# Patient Record
Sex: Male | Born: 1985 | Race: White | Hispanic: No | Marital: Single | Smoking: Former smoker
Health system: Southern US, Community
[De-identification: ages and names within clinical notes are randomized; demographics above are authoritative.]

---

## 2008-05-31 ENCOUNTER — Emergency Department (HOSPITAL_COMMUNITY): Admission: EM | Admit: 2008-05-31 | Discharge: 2008-05-31 | Payer: Self-pay | Admitting: Family Medicine

## 2011-01-15 ENCOUNTER — Emergency Department (INDEPENDENT_AMBULATORY_CARE_PROVIDER_SITE_OTHER): Payer: PRIVATE HEALTH INSURANCE

## 2011-01-15 ENCOUNTER — Emergency Department (HOSPITAL_BASED_OUTPATIENT_CLINIC_OR_DEPARTMENT_OTHER)
Admission: EM | Admit: 2011-01-15 | Discharge: 2011-01-15 | Disposition: A | Discharge status: Home / Self Care | Payer: PRIVATE HEALTH INSURANCE | Attending: Emergency Medicine | Admitting: Emergency Medicine

## 2011-01-15 DIAGNOSIS — M25519 Pain in unspecified shoulder: Secondary | ICD-10-CM

## 2011-01-15 DIAGNOSIS — IMO0002 Reserved for concepts with insufficient information to code with codable children: Secondary | ICD-10-CM | POA: Insufficient documentation

## 2011-01-15 DIAGNOSIS — Y9374 Activity, frisbee: Secondary | ICD-10-CM | POA: Insufficient documentation

## 2011-01-15 DIAGNOSIS — W1809XA Striking against other object with subsequent fall, initial encounter: Secondary | ICD-10-CM | POA: Insufficient documentation

## 2014-10-21 ENCOUNTER — Ambulatory Visit (INDEPENDENT_AMBULATORY_CARE_PROVIDER_SITE_OTHER): Payer: BLUE CROSS/BLUE SHIELD | Admitting: Family Medicine

## 2014-10-21 VITALS — BP 130/90 | HR 72 | Temp 98.1°F | Resp 16 | Ht 72.0 in | Wt 185.0 lb

## 2014-10-21 DIAGNOSIS — L600 Ingrowing nail: Secondary | ICD-10-CM

## 2014-10-21 DIAGNOSIS — M79672 Pain in left foot: Secondary | ICD-10-CM

## 2014-10-21 DIAGNOSIS — M79675 Pain in left toe(s): Secondary | ICD-10-CM

## 2014-10-21 MED ORDER — CEPHALEXIN 500 MG PO CAPS: 500.0000 mg | ORAL_CAPSULE | Freq: Three times a day (TID) | ORAL | Status: DC

## 2014-10-21 MED ORDER — HYDROCODONE-ACETAMINOPHEN 5-325 MG PO TABS: 1.0000 | ORAL_TABLET | ORAL | Status: DC | PRN

## 2014-10-21 NOTE — Patient Instructions (Signed)
Take pain pills one every 4-6 hours only if needed for severe pain  Take ibuprofen 600-800 mg 3 times daily if needed for moderate pain  Take the cephalexin antibiotic one pill 3 times daily

## 2014-10-21 NOTE — Progress Notes (Signed)
Subjective: 29 year old man who has an ingrown left large toenail. He tried to do a little running a few months ago, ran a 4 mile race 2. He had one in his right large toenail a year ago months ago. He has been fighting a [place] This been hurting him a lot. He is tried to do a wedge resection of the medial portion of the nail and let it grow out some. He has tried to cut down at the angle. He continues to hurt a lot.  Objective: Macerated appearing skin on the lateral portion of the toe nail, very tender to touch, swollen. Nail is been partially trimmed away.  Right toenail has almost regrown from 6 months ago  Assessment: Ingrowing left large toenail lateral portion  Plan: Partial toenail evulsion.

## 2014-10-21 NOTE — Progress Notes (Signed)
Procedure: Risk and benefits discussed and verbal consent obtained. The patient was anesthetized using 7 cc of 2% lidocaine and 3 cc of .05 Marcaine via digital block and local injection.  Sterile prep and drape. The medial 1/3 of the toe nail was lifted and clipped away.  Xeroform gauze placed and clean pressure dressing applied by CNA.  Wound instructions provided.  Patient tolerated procedure without complaint. Deliah BostonMichael Adylee Leonardo, MS, PA-C   9:56 AM, 10/21/2014

## 2015-03-28 ENCOUNTER — Other Ambulatory Visit: Payer: Self-pay | Admitting: Family Medicine

## 2015-03-28 DIAGNOSIS — R1011 Right upper quadrant pain: Secondary | ICD-10-CM

## 2015-04-01 ENCOUNTER — Ambulatory Visit
Admission: RE | Admit: 2015-04-01 | Discharge: 2015-04-01 | Disposition: A | Discharge status: Home / Self Care | Payer: BLUE CROSS/BLUE SHIELD | Source: Ambulatory Visit | Attending: Family Medicine | Admitting: Family Medicine

## 2015-04-01 DIAGNOSIS — R1011 Right upper quadrant pain: Secondary | ICD-10-CM

## 2015-04-02 ENCOUNTER — Other Ambulatory Visit: Payer: PRIVATE HEALTH INSURANCE

## 2015-04-09 ENCOUNTER — Other Ambulatory Visit (HOSPITAL_COMMUNITY): Payer: Self-pay | Admitting: Family Medicine

## 2015-04-09 DIAGNOSIS — R1011 Right upper quadrant pain: Secondary | ICD-10-CM

## 2015-04-17 ENCOUNTER — Encounter (HOSPITAL_COMMUNITY): Payer: Self-pay | Admitting: *Deleted

## 2015-04-17 ENCOUNTER — Emergency Department (HOSPITAL_COMMUNITY)
Admission: EM | Admit: 2015-04-17 | Discharge: 2015-04-17 | Disposition: A | Discharge status: Home / Self Care | Payer: BLUE CROSS/BLUE SHIELD | Attending: Emergency Medicine | Admitting: Emergency Medicine

## 2015-04-17 DIAGNOSIS — Z87891 Personal history of nicotine dependence: Secondary | ICD-10-CM | POA: Diagnosis not present

## 2015-04-17 DIAGNOSIS — R1011 Right upper quadrant pain: Secondary | ICD-10-CM | POA: Diagnosis not present

## 2015-04-17 DIAGNOSIS — R109 Unspecified abdominal pain: Secondary | ICD-10-CM | POA: Diagnosis present

## 2015-04-17 LAB — CBC
HCT: 43.7 % (ref 39.0–52.0)
Hemoglobin: 15.6 g/dL (ref 13.0–17.0)
MCH: 30.5 pg (ref 26.0–34.0)
MCHC: 35.7 g/dL (ref 30.0–36.0)
MCV: 85.4 fL (ref 78.0–100.0)
Platelets: 231 10*3/uL (ref 150–400)
RBC: 5.12 MIL/uL (ref 4.22–5.81)
RDW: 12.8 % (ref 11.5–15.5)
WBC: 8.2 10*3/uL (ref 4.0–10.5)

## 2015-04-17 LAB — COMPREHENSIVE METABOLIC PANEL
ALK PHOS: 56 U/L (ref 38–126)
ALT: 35 U/L (ref 17–63)
AST: 32 U/L (ref 15–41)
Albumin: 4.7 g/dL (ref 3.5–5.0)
Anion gap: 8 (ref 5–15)
BILIRUBIN TOTAL: 0.8 mg/dL (ref 0.3–1.2)
BUN: 11 mg/dL (ref 6–20)
CALCIUM: 10.2 mg/dL (ref 8.9–10.3)
CO2: 29 mmol/L (ref 22–32)
CREATININE: 1.05 mg/dL (ref 0.61–1.24)
Chloride: 102 mmol/L (ref 101–111)
GFR calc Af Amer: >60 mL/min (ref >60)
GFR calc non Af Amer: >60 mL/min (ref >60)
GLUCOSE: 103 mg/dL — AB (ref 65–99)
Potassium: 4 mmol/L (ref 3.5–5.1)
Sodium: 139 mmol/L (ref 135–145)
TOTAL PROTEIN: 7.9 g/dL (ref 6.5–8.1)

## 2015-04-17 LAB — LIPASE, BLOOD: LIPASE: 23 U/L (ref 22–51)

## 2015-04-17 NOTE — ED Notes (Signed)
Pt c/o right upper abdominal pain. Pt had [place] two wednesdays ago, dx with polyps on gall bladder. Pt states pain has increased, denies n/v/d.

## 2015-04-17 NOTE — Discharge Instructions (Signed)

## 2015-04-17 NOTE — ED Provider Notes (Signed)
CSN: [id number]     Arrival date & time 04/17/15  2030 History   First MD Initiated Contact with Patient 04/17/15 2238     Chief Complaint  Patient presents with  . Abdominal Pain     (Consider location/radiation/quality/duration/timing/severity/associated sxs/prior Treatment) Patient is a 29 y.o. male presenting with abdominal pain. The history is provided by the patient.  Abdominal Pain Pain location:  R flank Pain quality: sharp and shooting   Pain radiates to:  Does not radiate Pain severity:  Moderate Onset quality:  Gradual Duration:  36 weeks Timing:  Constant Progression:  Worsening Chronicity:  New Relieved by:  Nothing Worsened by:  Nothing tried Ineffective treatments:  None tried Associated symptoms: no chest pain, no chills, no diarrhea, no fever, no shortness of breath and no vomiting    29 yo M with a chief complaint of right side pain. This been going on for at least 9 months. Patient states that is getting progressively worse. Somewhat comes and goes. Patient says that the dull ache with a burning sensation. Denies any nausea or vomiting with this. Patient had a sudden worsening episode while eating dinner this evening wife said he had pale and diaphoretic. Lasted for a short period of time. On arrival here patient asymptomatic. Denies fevers chills diarrhea. Denies dark stools or blood in the stool.  History reviewed. No pertinent past medical history. History reviewed. No pertinent past surgical history. History reviewed. No pertinent family history. Social History  Substance Use Topics  . Smoking status: Former Smoker    Types: Cigarettes  . Smokeless tobacco: Never Used  . Alcohol Use: 0.0 oz/week    0 Standard drinks or equivalent per week    Review of Systems  Constitutional: Negative for fever and chills.  HENT: Negative for congestion and facial swelling.   Eyes: Negative for discharge and visual disturbance.  Respiratory: Negative for shortness of  breath.   Cardiovascular: Negative for chest pain and palpitations.  Gastrointestinal: Positive for abdominal pain. Negative for vomiting and diarrhea.  Musculoskeletal: Negative for myalgias and arthralgias.  Skin: Negative for color change and rash.  Neurological: Negative for tremors, syncope and headaches.  Psychiatric/Behavioral: Negative for confusion and dysphoric mood.      Allergies  Review of patient's allergies indicates no known allergies.  Home Medications   Prior to Admission medications   Medication Sig Start Date End Date Taking? Authorizing Provider  Misc Natural Products (ENERGY SUPPORT PO) Take 1 tablet by mouth 2 (two) times daily.   Yes Historical Provider, MD  OVER THE COUNTER MEDICATION Take 2 packets by mouth 2 (two) times daily. Double X. Vitamin packet.   Yes Historical Provider, MD   BP 148/91 mmHg  Pulse 74  Temp(Src) 98.3 F (36.8 C) (Oral)  Resp 22  Ht  (1.803 m)  Wt 190 lb (86.183 kg)  BMI 26.51 kg/m2  SpO2 99% Physical Exam  Constitutional: He is oriented to person, place, and time. He appears well-developed and well-nourished.  HENT:  Head: Normocephalic and atraumatic.  Eyes: EOM are normal. Pupils are equal, round, and reactive to light.  Neck: Normal range of motion. Neck supple. No JVD present.  Cardiovascular: Normal rate and regular rhythm.  Exam reveals no gallop and no friction rub.   No murmur heard. Pulmonary/Chest: No respiratory distress. He has no wheezes.  Abdominal: He exhibits no distension. There is tenderness (mild TTP about the R side lateral to normal location of gallbladder.  No noted chest  wall ttp). There is no rebound and no guarding.  Musculoskeletal: Normal range of motion.  Neurological: He is alert and oriented to person, place, and time.  Skin: No rash noted. No pallor.  Psychiatric: He has a normal mood and affect. His behavior is normal.    ED Course  Procedures (including critical care time) Labs  Review Labs Reviewed  COMPREHENSIVE METABOLIC PANEL - Abnormal; Notable for the following:    Glucose, Bld 103 (*)    All other components within normal limits  LIPASE, BLOOD  CBC  URINALYSIS, ROUTINE W REFLEX MICROSCOPIC (NOT AT [facility])    Imaging Review No results found. I have personally reviewed and evaluated these images and lab results as part of my medical decision-making.   EKG Interpretation None      MDM   Final diagnoses:  Right upper quadrant pain    29 yo M with a chief complaint of right thigh pain. Patient had an ultrasound done about 2 weeks ago that showed that he had a gallbladder polyp as a nuclear medicine scan ordered later this month. Benign abdominal exam laboratory work unremarkable. Recommend that the patient follow-up with his PCP for other etiologies of this pain. Discussed possibility of gastric versus duodenal ulcers as an alternative source.  11:30 PM:  I have discussed the diagnosis/risks/treatment options with the patient and family and believe the pt to be eligible for discharge home to follow-up with PCP. We also discussed returning to the ED immediately if new or worsening sx occur. We discussed the sx which are most concerning (e.g., sudden worsening pain, fever) that necessitate immediate return. Medications administered to the patient during their visit and any new prescriptions provided to the patient are listed below.  Medications given during this visit Medications - No data to display  New Prescriptions   No medications on file     The patient appears reasonably screen and/or stabilized for discharge and I doubt any other medical condition or other [facility] requiring further screening, evaluation, or treatment in the ED at this time prior to discharge.      Melene Plan, DO 04/17/15 [phone]

## 2015-04-17 NOTE — ED Notes (Signed)
Pt verbalized understanding of d/c instructions and has no further questions. Pt stable and NAD. Pt to follow up with nuclear scan on Tuesday,.

## 2015-04-22 ENCOUNTER — Ambulatory Visit (HOSPITAL_COMMUNITY)
Admission: RE | Admit: 2015-04-22 | Discharge: 2015-04-22 | Disposition: A | Discharge status: Home / Self Care | Payer: BLUE CROSS/BLUE SHIELD | Source: Ambulatory Visit | Attending: Family Medicine | Admitting: Family Medicine

## 2015-04-22 DIAGNOSIS — R1011 Right upper quadrant pain: Secondary | ICD-10-CM

## 2015-04-22 MED ORDER — STERILE WATER FOR INJECTION IJ SOLN
INTRAMUSCULAR | Status: AC
  Administered 2015-04-22: 5 mL
  Filled 2015-04-22: qty 10

## 2015-04-22 MED ORDER — SINCALIDE 5 MCG IJ SOLR
INTRAMUSCULAR | Status: AC
  Administered 2015-04-22: 1.7 ug via INTRAVENOUS
  Filled 2015-04-22: qty 5

## 2015-04-22 MED ORDER — TECHNETIUM TC 99M MEBROFENIN IV KIT
5.4000 | PACK | Freq: Once | INTRAVENOUS | Status: DC | PRN
  Administered 2015-04-22: 5 via INTRAVENOUS
  Filled 2015-04-22: qty 6

## 2015-04-22 MED ORDER — SINCALIDE 5 MCG IJ SOLR
0.0200 ug/kg | Freq: Once | INTRAMUSCULAR | Status: AC
  Administered 2015-04-22: 1.7 ug via INTRAVENOUS

## 2015-05-26 ENCOUNTER — Ambulatory Visit (INDEPENDENT_AMBULATORY_CARE_PROVIDER_SITE_OTHER): Payer: BLUE CROSS/BLUE SHIELD | Admitting: Family Medicine

## 2015-05-26 VITALS — BP 106/68 | HR 99 | Temp 98.5°F | Resp 18 | Ht 72.0 in | Wt 178.0 lb

## 2015-05-26 DIAGNOSIS — J028 Acute pharyngitis due to other specified organisms: Secondary | ICD-10-CM | POA: Diagnosis not present

## 2015-05-26 MED ORDER — AMOXICILLIN 875 MG PO TABS: 875.0000 mg | ORAL_TABLET | Freq: Two times a day (BID) | ORAL | Status: DC

## 2015-05-26 NOTE — Progress Notes (Signed)
This chart was scribed for Joe Sidle, MD by Stann Ore, medical scribe at [facility].The patient was seen in exam room 2 and the patient's care was started at 5:09 PM.  Patient ID: Joe Bauer MRN: [id number], DOB: 1986-02-16, 29 y.o. Date of Encounter: 05/26/2015  Primary Physician: No PCP Per Patient  Chief Complaint:  Chief Complaint  Patient presents with  . Sore Throat    x 2 days, spitting up blood, pus in the back of throat     HPI:  Joe Bauer is a 29 y.o. male who presents to [facility] complaining of URI symptoms that started 3 weeks ago.  His wife thought it became viral and had pus pockets. He also started to spit up blood noticed 2 days ago.   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   History reviewed. No pertinent past medical history.   Home Meds: Prior to Admission medications   Medication Sig Start Date End Date Taking? Authorizing Provider  Misc Natural Products (ENERGY SUPPORT PO) Take 1 tablet by mouth 2 (two) times daily.   Yes Historical Provider, MD  OVER THE COUNTER MEDICATION Take 2 packets by mouth 2 (two) times daily. Double X. Vitamin packet.   Yes Historical Provider, MD  amoxicillin (AMOXIL) 875 MG tablet Take 1 tablet (875 mg total) by mouth 2 (two) times daily. 05/26/15   Joe Sidle, MD    Allergies: No Known Allergies  Social History   Social History  . Marital Status: Single    Spouse Name: N/A  . Number of Children: N/A  . Years of Education: N/A   Occupational History  . Not on file.   Social History Main Topics  . Smoking status: Former Smoker    Types: Cigarettes  . Smokeless tobacco: Never Used  . Alcohol Use: 0.0 oz/week    0 Standard drinks or equivalent per week  . Drug Use: No  . Sexual Activity: Not on file   Other Topics Concern  . Not on file   Social History Narrative     Review of Systems: Constitutional: negative for chills, fever,  night sweats, weight changes, or fatigue  HEENT: negative for vision changes, hearing loss, congestion, rhinorrhea, epistaxis, or sinus pressure; positive for sore throat Cardiovascular: negative for chest pain or palpitations Respiratory: negative for hemoptysis, wheezing, shortness of breath, or cough Abdominal: negative for abdominal pain, nausea, vomiting, diarrhea, or constipation Dermatological: negative for rash Neurologic: negative for headache, dizziness, or syncope All other systems reviewed and are otherwise negative with the exception to those above and in the HPI.  Physical Exam: Blood pressure 106/68, pulse 99, temperature 98.5 F (36.9 C), temperature source Oral, resp. rate 18, height 6' (1.829 m), weight 178 lb (80.74 kg), SpO2 98 %., Body mass index is 24.14 kg/(m^2). General: Well developed, well nourished, in no acute distress. Head: Normocephalic, atraumatic, eyes without discharge, sclera non-icteric, nares are without discharge. Bilateral auditory canals clear, TM's are without perforation, pearly grey and translucent with reflective cone of light bilaterally. Posterior oropharynx erythema  Neck: Supple. No thyromegaly. Full ROM. No lymphadenopathy. Lungs: Clear bilaterally to auscultation without wheezes, rales, or rhonchi. Breathing is unlabored. Heart: RRR with S1 S2. No murmurs, rubs, or gallops appreciated. Abdomen: Soft, non-tender, non-distended with normoactive bowel sounds. No hepatomegaly. No rebound/guarding. No obvious abdominal masses. Msk:  Strength and tone normal for age. Extremities/Skin: Warm and dry. No clubbing or cyanosis.  No edema. No rashes or suspicious lesions. Neuro: Alert and oriented X 3. Moves all extremities spontaneously. Gait is normal. CNII-XII grossly in tact. Psych:  Responds to questions appropriately with a normal affect.    ASSESSMENT AND PLAN:  29 y.o. year old male with  This chart was scribed in my presence and reviewed by me  personally.    ICD-9-CM ICD-10-CM   1. Acute pharyngitis due to other specified organisms 462 J02.8 amoxicillin (AMOXIL) 875 MG tablet      By signing my name below, I, Stann Ore, attest that this documentation has been prepared under the direction and in the presence of Joe Sidle, MD. Electronically Signed: Stann Ore, Scribe. 05/26/2015 , 5:22 PM .  Signed, Joe Sidle, MD 05/26/2015 5:22 PM

## 2015-05-26 NOTE — Patient Instructions (Signed)

## 2015-12-25 ENCOUNTER — Emergency Department (HOSPITAL_COMMUNITY): Payer: BLUE CROSS/BLUE SHIELD

## 2015-12-25 ENCOUNTER — Encounter (HOSPITAL_COMMUNITY): Payer: Self-pay | Admitting: *Deleted

## 2015-12-25 ENCOUNTER — Emergency Department (HOSPITAL_COMMUNITY)
Admission: EM | Admit: 2015-12-25 | Discharge: 2015-12-25 | Disposition: A | Discharge status: Home / Self Care | Payer: BLUE CROSS/BLUE SHIELD | Attending: Emergency Medicine | Admitting: Emergency Medicine

## 2015-12-25 DIAGNOSIS — R1011 Right upper quadrant pain: Secondary | ICD-10-CM | POA: Diagnosis present

## 2015-12-25 DIAGNOSIS — R1013 Epigastric pain: Secondary | ICD-10-CM | POA: Diagnosis not present

## 2015-12-25 DIAGNOSIS — R509 Fever, unspecified: Secondary | ICD-10-CM | POA: Diagnosis not present

## 2015-12-25 DIAGNOSIS — Z87891 Personal history of nicotine dependence: Secondary | ICD-10-CM | POA: Insufficient documentation

## 2015-12-25 DIAGNOSIS — R197 Diarrhea, unspecified: Secondary | ICD-10-CM | POA: Diagnosis not present

## 2015-12-25 DIAGNOSIS — R112 Nausea with vomiting, unspecified: Secondary | ICD-10-CM

## 2015-12-25 DIAGNOSIS — Z79899 Other long term (current) drug therapy: Secondary | ICD-10-CM | POA: Diagnosis not present

## 2015-12-25 DIAGNOSIS — Z8719 Personal history of other diseases of the digestive system: Secondary | ICD-10-CM | POA: Diagnosis not present

## 2015-12-25 DIAGNOSIS — R61 Generalized hyperhidrosis: Secondary | ICD-10-CM | POA: Insufficient documentation

## 2015-12-25 LAB — DIFFERENTIAL
Basophils Absolute: 0 10*3/uL (ref 0.0–0.1)
Basophils Relative: 0 %
EOS PCT: 2 %
Eosinophils Absolute: 0.2 10*3/uL (ref 0.0–0.7)
LYMPHS ABS: 0.8 10*3/uL (ref 0.7–4.0)
LYMPHS PCT: 9 %
Monocytes Absolute: 1.1 10*3/uL — ABNORMAL HIGH (ref 0.1–1.0)
Monocytes Relative: 14 %
NEUTROS PCT: 75 %
Neutro Abs: 6.3 10*3/uL (ref 1.7–7.7)

## 2015-12-25 LAB — COMPREHENSIVE METABOLIC PANEL
ALT: 243 U/L — ABNORMAL HIGH (ref 17–63)
ANION GAP: 11 (ref 5–15)
AST: 258 U/L — AB (ref 15–41)
Albumin: 4.4 g/dL (ref 3.5–5.0)
Alkaline Phosphatase: 69 U/L (ref 38–126)
BILIRUBIN TOTAL: 2.4 mg/dL — AB (ref 0.3–1.2)
BUN: 9 mg/dL (ref 6–20)
CHLORIDE: 103 mmol/L (ref 101–111)
CO2: 26 mmol/L (ref 22–32)
Calcium: 9.5 mg/dL (ref 8.9–10.3)
Creatinine, Ser: 1.01 mg/dL (ref 0.61–1.24)
GFR calc Af Amer: >60 mL/min (ref >60)
Glucose, Bld: 117 mg/dL — ABNORMAL HIGH (ref 65–99)
POTASSIUM: 3.9 mmol/L (ref 3.5–5.1)
Sodium: 140 mmol/L (ref 135–145)
TOTAL PROTEIN: 8 g/dL (ref 6.5–8.1)

## 2015-12-25 LAB — URINALYSIS, ROUTINE W REFLEX MICROSCOPIC
Bilirubin Urine: NEGATIVE
Glucose, UA: NEGATIVE mg/dL
Hgb urine dipstick: NEGATIVE
Ketones, ur: NEGATIVE mg/dL
LEUKOCYTES UA: NEGATIVE
NITRITE: NEGATIVE
PROTEIN: NEGATIVE mg/dL
Specific Gravity, Urine: 1.012 (ref 1.005–1.030)
pH: 6.5 (ref 5.0–8.0)

## 2015-12-25 LAB — CBC
HCT: 45.9 % (ref 39.0–52.0)
Hemoglobin: 15.8 g/dL (ref 13.0–17.0)
MCH: 29.3 pg (ref 26.0–34.0)
MCHC: 34.4 g/dL (ref 30.0–36.0)
MCV: 85 fL (ref 78.0–100.0)
PLATELETS: 195 10*3/uL (ref 150–400)
RBC: 5.4 MIL/uL (ref 4.22–5.81)
RDW: 12.9 % (ref 11.5–15.5)
WBC: 8.4 10*3/uL (ref 4.0–10.5)

## 2015-12-25 LAB — I-STAT TROPONIN, ED: TROPONIN I, POC: 0.01 ng/mL (ref 0.00–0.08)

## 2015-12-25 LAB — LIPASE, BLOOD: LIPASE: 31 U/L (ref 11–51)

## 2015-12-25 MED ORDER — SODIUM CHLORIDE 0.9 % IV SOLN
Freq: Once | INTRAVENOUS | Status: AC
  Administered 2015-12-25: 06:00:00 via INTRAVENOUS

## 2015-12-25 MED ORDER — IOPAMIDOL (ISOVUE-300) INJECTION 61%
INTRAVENOUS | Status: AC
  Administered 2015-12-25: 100 mL
  Filled 2015-12-25: qty 100

## 2015-12-25 MED ORDER — SODIUM CHLORIDE 0.9 % IV SOLN
Freq: Once | INTRAVENOUS | Status: AC
  Administered 2015-12-25: 08:00:00 via INTRAVENOUS

## 2015-12-25 MED ORDER — ONDANSETRON HCL 4 MG/2ML IJ SOLN
4.0000 mg | Freq: Once | INTRAMUSCULAR | Status: AC | PRN
  Administered 2015-12-25: 4 mg via INTRAVENOUS
  Filled 2015-12-25: qty 2

## 2015-12-25 MED ORDER — FENTANYL CITRATE (PF) 100 MCG/2ML IJ SOLN
50.0000 ug | INTRAMUSCULAR | Status: DC | PRN
  Administered 2015-12-25: 50 ug via INTRAVENOUS
  Filled 2015-12-25: qty 2

## 2015-12-25 MED ORDER — DICYCLOMINE HCL 20 MG PO TABS: 20.0000 mg | ORAL_TABLET | Freq: Two times a day (BID) | ORAL | Status: AC

## 2015-12-25 MED ORDER — ONDANSETRON 4 MG PO TBDP: 4.0000 mg | ORAL_TABLET | Freq: Three times a day (TID) | ORAL | Status: AC | PRN

## 2015-12-25 NOTE — ED Notes (Signed)
Patient transported to CT 

## 2015-12-25 NOTE — ED Notes (Signed)
Pt to c/o generalized abd pain worsening tonight. Reports on Monday ate chicken and had NVD. Tonight felt worsening epigastric pain with NV, denies diarrhea since Tuesday.

## 2015-12-25 NOTE — ED Provider Notes (Signed)
CSN: [id number]     Arrival date & time 12/25/15  0533 History   First MD Initiated Contact with Patient 12/25/15 0557     Chief Complaint  Patient presents with  . Abdominal Pain     (Consider location/radiation/quality/duration/timing/severity/associated sxs/prior Treatment) HPI   Joe Bauer is a 30 y.o. male, with a history of gallbladder polyps, presenting to the ED with abdominal pain that began April 24. Patient states that he felt that he may have had food poisoning on Monday, April 24 because he ate a meal from an unknown restaurant, and then began vomiting at least once an hour for the next 12 hours. This was also accompanied by frequent diarrhea. The abdominal pain at that point was epigastric. Patient's pain subsided somewhat and then resurged last night to the worst that it's ever been. Patient currently complains of right upper quadrant and epigastric pain, rates it 7 out of 10, squeezing in nature, intermittently radiating to the left upper quadrant. Patient adds that very early this morning he began to have a fever of 100.79F. Patient is accompanied by his wife at the bedside, Joe Bauer, who states that the patient never complains of pain and would ordinarily be reluctant to go see a doctor much less go to the emergency room, thus his willingness to be here is significant. Patient denies any history of abdominal surgeries. Current complaints include abdominal pain, nausea, and diaphoresis. Patient denies recent travel, family history of sudden cardiac death, abdominal surgeries, urinary issues, or any other complaints.    History reviewed. No pertinent past medical history. History reviewed. No pertinent past surgical history. No family history on file. Social History  Substance Use Topics  . Smoking status: Former Smoker    Types: Cigarettes  . Smokeless tobacco: Never Used  . Alcohol Use: 0.0 oz/week    0 Standard drinks or equivalent per week    Review of Systems   Constitutional: Positive for fever and diaphoresis.  Respiratory: Negative for shortness of breath.   Cardiovascular: Negative for chest pain.  Gastrointestinal: Positive for nausea, vomiting (resolved), abdominal pain and diarrhea (resolved). Negative for blood in stool.  Genitourinary: Negative for dysuria, hematuria, discharge, penile pain and testicular pain.  Musculoskeletal: Negative for back pain.  Neurological: Negative for dizziness and light-headedness.  All other systems reviewed and are negative.     Allergies  Review of patient's allergies indicates no known allergies.  Home Medications   Prior to Admission medications   Medication Sig Start Date End Date Taking? Authorizing Provider  amphetamine-dextroamphetamine (ADDERALL) 10 MG tablet Take 10 mg by mouth See admin instructions. Take 1 tablet every day, can take another tablet as needed for ADHD   Yes Historical Provider, MD  Misc Natural Products (ENERGY SUPPORT PO) Take 1 tablet by mouth 2 (two) times daily.   Yes Historical Provider, MD  OVER THE COUNTER MEDICATION Take 2 packets by mouth 2 (two) times daily. Double X. Vitamin packet.   Yes Historical Provider, MD  dicyclomine (BENTYL) 20 MG tablet Take 1 tablet (20 mg total) by mouth 2 (two) times daily. 12/25/15   Bernetta Sutley C Adalyn Pennock, PA-C  ondansetron (ZOFRAN ODT) 4 MG disintegrating tablet Take 1 tablet (4 mg total) by mouth every 8 (eight) hours as needed for nausea or vomiting. 12/25/15   Jonita Hirota C Shawna Wearing, PA-C   BP 103/62 mmHg  Pulse 52  Temp(Src) 98.1 F (36.7 C) (Oral)  Resp 16  Ht 6' (1.829 m)  Wt 77.111 kg  BMI 23.05 kg/m2  SpO2 99% Physical Exam  Constitutional: He appears well-developed and well-nourished.  HENT:  Head: Normocephalic and atraumatic.  Mouth/Throat: Oropharynx is clear and moist.  Eyes: Conjunctivae are normal. Pupils are equal, round, and reactive to light.  Neck: Neck supple.  Cardiovascular: Normal rate, regular rhythm, normal heart sounds  and intact distal pulses.   Pulmonary/Chest: Effort normal and breath sounds normal. No respiratory distress.  Abdominal: Soft. Normal appearance and bowel sounds are normal. There is tenderness in the right upper quadrant and epigastric area. There is no guarding and no CVA tenderness.  Musculoskeletal: He exhibits no edema or tenderness.  Lymphadenopathy:    He has no cervical adenopathy.  Neurological: He is alert.  Skin: Skin is warm. He is diaphoretic.  Psychiatric: He has a normal mood and affect. His behavior is normal.  Nursing note and vitals reviewed.   ED Course  Procedures (including critical care time) Labs Review Labs Reviewed  COMPREHENSIVE METABOLIC PANEL - Abnormal; Notable for the following:    Glucose, Bld 117 (*)    AST 258 (*)    ALT 243 (*)    Total Bilirubin 2.4 (*)    All other components within normal limits  DIFFERENTIAL - Abnormal; Notable for the following:    Monocytes Absolute 1.1 (*)    All other components within normal limits  URINE CULTURE  LIPASE, BLOOD  CBC  URINALYSIS, ROUTINE W REFLEX MICROSCOPIC (NOT AT [facility])  I-STAT TROPOININ, ED    Imaging Review Ct Abdomen Pelvis W Contrast  12/25/2015  CLINICAL DATA:  Right upper quadrant pain, epigastric pain, possible food poisoning, vomiting and diarrhea for few days EXAM: CT ABDOMEN AND PELVIS WITH CONTRAST TECHNIQUE: Multidetector CT imaging of the abdomen and pelvis was performed using the standard protocol following bolus administration of intravenous contrast. CONTRAST:  ISOVUE-300 IOPAMIDOL (ISOVUE-300) INJECTION 61% COMPARISON:  None. FINDINGS: Lower chest:  Lung bases are unremarkable. Hepatobiliary: Enhanced liver is unremarkable. No focal hepatic mass. No intrahepatic biliary ductal dilatation. No calcified gallstones are noted within gallbladder. No CBD dilatation. Pancreas: No mass, inflammatory changes, or other significant abnormality. Spleen: Within normal limits in size and  appearance. Adrenals/Urinary Tract: No adrenal gland mass. Enhanced kidneys are symmetrical in size. No hydronephrosis or hydroureter. The urinary bladder is unremarkable. Stomach/Bowel: No gastric outlet obstruction. No small bowel obstruction. No thickened or dilated small bowel loops. Moderate stool noted in right colon and cecum. No pericecal inflammation. Normal appendix partially visualized in coronal image 46. The terminal ileum is unremarkable. No evidence of colitis or diverticulitis. No distal colonic obstruction. Vascular/Lymphatic: No aortic aneurysm. No retroperitoneal or mesenteric adenopathy. Reproductive: Prostate gland and seminal vesicles are unremarkable. Other: There is no ascites or free air.  No inguinal adenopathy. Musculoskeletal: No destructive bony lesions are noted. Sagittal images of the spine are unremarkable. IMPRESSION: 1. There is no definite evidence of acute inflammatory process within abdomen. No pericecal inflammation. Normal appendix. Moderate stool noted in right colon and cecum. 2. No small bowel obstruction.  No colitis or diverticulitis. 3. No hydronephrosis or hydroureter. 4. No calcified gallstones are noted within gallbladder. Electronically Signed   By: Natasha Mead M.D.   On: 12/25/2015 08:45   I have personally reviewed and evaluated these images and lab results as part of my medical decision-making.   EKG Interpretation None        MDM   Final diagnoses:  Right upper quadrant pain  Non-intractable vomiting with nausea, vomiting of  unspecified type  Diarrhea, unspecified type  Fever, unspecified fever cause    Joe Bauer presents with abdominal pain, nausea, vomiting, diarrhea, and fever for the last 4 days.  Findings and plan of care discussed with Gilda Creasehristopher J Pollina, MD.   Patient's symptoms are consistent with a GI illness such as gastroenteritis. There is some suspicion of biliary colic, however. Patient's labs reveal no abnormalities.  Patient is noted to have had an abdominal ultrasound in August 2016 which revealed multiple gallbladder polyps. He subsequently had a HIDA scan performed that was normal with an ejection fraction of 96%. The patient and especially the patient's wife are adamant that the patient presenting with this much pain as highly unusual. Based on this testimony, I believe an abdominal CT is warranted. Upon reevaluation, patient states that his pain and nausea have subsided. CT shows no evidence of acute abnormalities. Suspect that the patient may have a viral illness. Home care and return precautions discussed. Patient to follow up with PCP or return to the ED should symptoms fail to resolve. Patient voiced understanding of these instructions and is comfortable with discharge.   Filed Vitals:   12/25/15 0754 12/25/15 0756 12/25/15 0759 12/25/15 0933  BP: 116/67 117/77 111/74 103/62  Pulse: 51 63 84 52  Temp:    98.1 F (36.7 C)  TempSrc:    Oral  Resp: 18   16  Height:      Weight:      SpO2: 100%   99%       Anselm PancoastShawn C Jewell Ryans, PA-C 12/25/15 1020  Gilda Creasehristopher J Pollina, MD 12/26/15 [phone]

## 2015-12-25 NOTE — Discharge Instructions (Signed)
You have been seen today for abdominal pain, fever, nausea, vomiting, and diarrhea. Your imaging and lab tests showed no abnormalities. Your symptoms are consistent with a viral illness. Viruses do not require antibiotics. Treatment is symptomatic care. Drink plenty of fluids and get plenty of rest. You should be drinking at least a liter of water an hour to stay hydrated. Ibuprofen or Tylenol for pain or fever. Zofran for nausea. Bentyl for abdominal discomfort. Follow up with PCP as needed should symptoms continue. Return to ED should symptoms worsen.  RESOURCE GUIDE  Chronic Pain Problems: Contact [facility]  [phone] Patients need to be referred by their primary care doctor.  Insufficient Money for Medicine: Contact United Way:  call "211" or [facility] 765-225-6324.  No Primary Care Doctor: - Call [facility]  [phone] - can help you locate a primary care doctor that  accepts your insurance, provides certain services, etc. - Physician Referral Service- [phone]  Agencies that provide inexpensive medical care: - [facility]  [phone] - [facility]  [phone] - [facility]  [phone] - Women's Clinic  [phone] - Planned Parenthood  [phone] [facility]  [phone]  Medicaid-accepting [facility] Providers: - [facility]- [address]  [phone], Mon-Fri 9am-7pm, Sat 9am-1pm - [facility]- [address] [place], Suite [place]  [phone] - [facility]- [address] [place]  [phone] [facility]- [address]  [phone] - Renaye Rakers- [address] [place], [address], [phone]  Only accepts [place] patients after they have their name  applied to their card  Self Pay (no insurance) in [place]: - Sickle Cell Patients: Dr Willey Blade, [facility]  [address] [place], [phone] - [facility]- [address] [place]  [phone]       [facility] [place]- [address]       -     [facility]- see information above (Speak to [employer] if you do not have insurance)       -  [facility]- [address] [place], [phone]       -  [facility]- [address],  [phone]       -  [facility]- [address] [phone]       -  Dr Julio Sicks-  [address], [place], [phone]       -  [facility]- [address] [phone]       -  [facility]- [address] [phone], also [address] [phone]       -    [facility]- [address] [place], [phone], 1st & 3rd Saturday   every month, 10am-1pm  1) Find a Doctor and Pay Out of Pocket Although you won't have to find out who is covered by your insurance plan, it is a good idea to ask around and get recommendations. You will then need to call the office and see if the doctor you have chosen will accept you as a new patient and what types of options they offer for patients who are self-pay. Some doctors offer discounts or will set up payment plans for their patients  who do not have insurance, but you will need to ask so you aren't surprised when you get to your appointment.  2) Contact Your Local Health Department Not all health departments have doctors that can see patients for sick visits, but many do, so it is worth a call to see if yours does. If you don't know where your local health department is, you can check in your phone book. The CDC also has a tool to help you locate your state's health department, and many state websites also have listings of all of their local health departments.  3) Find a Walk-in Clinic If your illness is not likely to be very severe or complicated, you may want to try a walk in clinic. These are popping up all over the country in  pharmacies, drugstores, and shopping centers. They're usually staffed by nurse practitioners or physician assistants that have been trained to treat common illnesses and complaints. They're usually fairly quick and inexpensive. However, if you have serious medical issues or chronic medical problems, these are probably not your best option  STD Testing - [facility] [place], STD Clinic, [address], [place], phone [phone] or [phone].  Monday - Friday, call for an appointment. [facility]- [place] [facility] of [facility] [place], STD Clinic, [place], [place], phone [phone] or [phone].  Monday - Friday, call for an appointment.  Abuse/Neglect: [facility]- [place] Child Abuse Hotline 646-096-2470(336) (718)011-2197 [facility]- [place] Child Abuse Hotline 716-887-9490417-467-4851 (After Hours)  Emergency Shelter:  [facility] (405)351-5665(336) 579-054-7140  Maternity Homes: - [facility] (814) 343-7635(336) (708)[phone] - [facility] (716)095-0227(704) [phone]  MRSA Hotline #:   (253)882-0212(838)135-2332  [facility] Resources  [facility] [place]  United Way [facility]. [address]                 [address]         [address]  [place]                                               [place]                              [place] Phone:  [phone]                                  Phone:  [phone]                   Phone:  [phone]  [facility], [phone] - [facility] Services - [phone]- [phone]       -     [facility] in [place], [address],                                  [phone], Insurance  [place] Child Abuse Hotline 361-604-5611(336) 828-242-8020 or 9302761703(336) 671-752-6254 (After Hours)   Behavioral Health Services  Substance Abuse Resources: - [facility]  (725)800-3186631-190-5620 - [facility]  [phone] - [facility]  [place] - Residential & Outpatient Substance Abuse Program  339-842-9047  Psychological Services: - [place], [place] [address], [place], [place]: (580)[phone] or ([phone], [employer]  Dental Assistance  If unable to pay or uninsured, contact:  [facility] or [facility] to become qualified for the adult dental clinic.  Patients with Medicaid: [facility] [id number], [place] [address], [phone]  If unable to pay, or uninsured, contact [facility] (702) [phone]) or [place] [phone] in [place], [place] in [facility]) to become qualified for the adult dental clinic   Other [place]- [place], [place], [place], [address], [place], [place], 2nd and 4th Thursday of the month at 6:30am.  10 clients each day by appointment, can sometimes see walk-in patients if someone does not show for an appointment. [facility]- [address] [place]ard Danker [place], [place], [address], [place], [place], [place], [address], [place] Department- [place] Department- [employer] Department- [phone]

## 2015-12-26 LAB — URINE CULTURE: Culture: NO GROWTH

## 2015-12-29 ENCOUNTER — Encounter (HOSPITAL_BASED_OUTPATIENT_CLINIC_OR_DEPARTMENT_OTHER): Payer: Self-pay | Admitting: Emergency Medicine

## 2016-03-09 DIAGNOSIS — G4712 Idiopathic hypersomnia without long sleep time: Secondary | ICD-10-CM | POA: Diagnosis not present

## 2016-03-25 DIAGNOSIS — L03032 Cellulitis of left toe: Secondary | ICD-10-CM | POA: Diagnosis not present

## 2016-03-25 DIAGNOSIS — L600 Ingrowing nail: Secondary | ICD-10-CM | POA: Diagnosis not present

## 2016-03-25 DIAGNOSIS — L03031 Cellulitis of right toe: Secondary | ICD-10-CM | POA: Diagnosis not present

## 2016-04-05 DIAGNOSIS — L03032 Cellulitis of left toe: Secondary | ICD-10-CM | POA: Diagnosis not present

## 2016-04-05 DIAGNOSIS — L03031 Cellulitis of right toe: Secondary | ICD-10-CM | POA: Diagnosis not present

## 2016-04-05 DIAGNOSIS — L02611 Cutaneous abscess of right foot: Secondary | ICD-10-CM | POA: Diagnosis not present

## 2016-04-05 DIAGNOSIS — L02612 Cutaneous abscess of left foot: Secondary | ICD-10-CM | POA: Diagnosis not present

## 2016-04-05 DIAGNOSIS — M79675 Pain in left toe(s): Secondary | ICD-10-CM | POA: Diagnosis not present

## 2016-04-16 DIAGNOSIS — J069 Acute upper respiratory infection, unspecified: Secondary | ICD-10-CM | POA: Diagnosis not present

## 2016-04-19 DIAGNOSIS — R05 Cough: Secondary | ICD-10-CM | POA: Diagnosis not present

## 2016-04-19 DIAGNOSIS — J069 Acute upper respiratory infection, unspecified: Secondary | ICD-10-CM | POA: Diagnosis not present

## 2016-04-20 DIAGNOSIS — L03032 Cellulitis of left toe: Secondary | ICD-10-CM | POA: Diagnosis not present

## 2016-05-05 DIAGNOSIS — D2361 Other benign neoplasm of skin of right upper limb, including shoulder: Secondary | ICD-10-CM | POA: Diagnosis not present

## 2016-05-05 DIAGNOSIS — D225 Melanocytic nevi of trunk: Secondary | ICD-10-CM | POA: Diagnosis not present

## 2016-05-05 DIAGNOSIS — D485 Neoplasm of uncertain behavior of skin: Secondary | ICD-10-CM | POA: Diagnosis not present

## 2016-09-07 DIAGNOSIS — G4712 Idiopathic hypersomnia without long sleep time: Secondary | ICD-10-CM | POA: Diagnosis not present

## 2016-09-18 IMAGING — CT CT ABD-PELV W/ CM
2 of 4 series · 10 of 46 positions shown, 11 images · IV contrast (Iodine)
Comparison: None.

CLINICAL DATA: Right upper quadrant pain, epigastric pain, possible
food poisoning, vomiting and diarrhea for few days

EXAM:
CT ABDOMEN AND PELVIS WITH CONTRAST
TECHNIQUE: Multidetector CT imaging of the abdomen and pelvis was performed
using the standard protocol following bolus administration of
intravenous contrast.
CONTRAST:  100mL D4KQ8Z-STT IOPAMIDOL (D4KQ8Z-STT) INJECTION 61%

[Series 201: routine, idose (2) · axial · 0.70mm/px · z∈[+59,+444]mm · 7 of 97 slices shown, 8 images]
[im 10/97  soft-tissue]
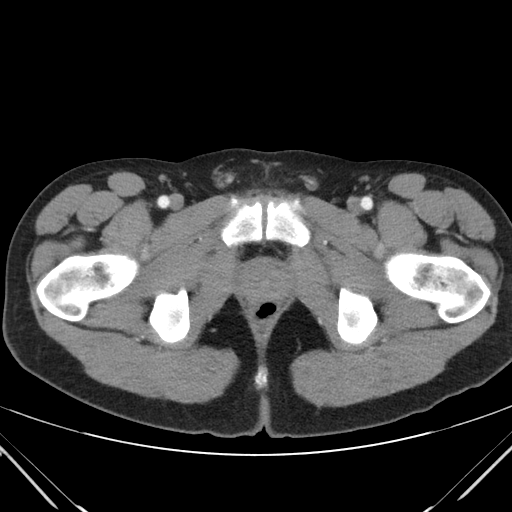
[im 10/97  bone]
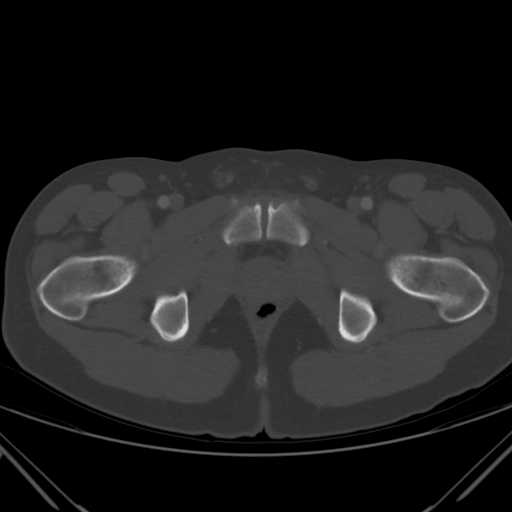
[im 23/97  soft-tissue]
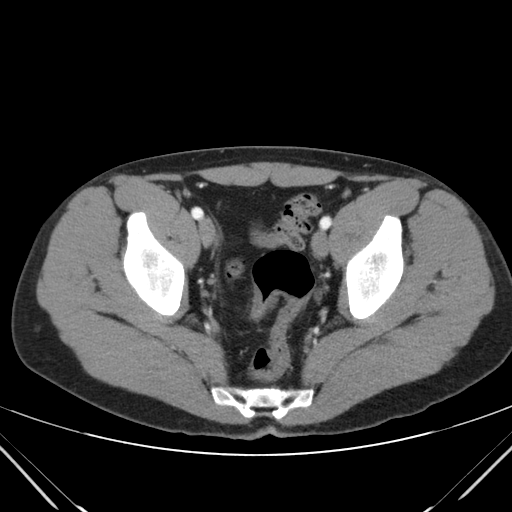
[im 37/97  soft-tissue]
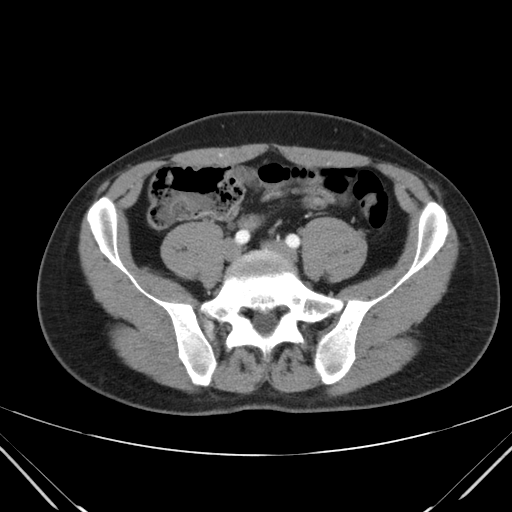
[im 51/97  soft-tissue]
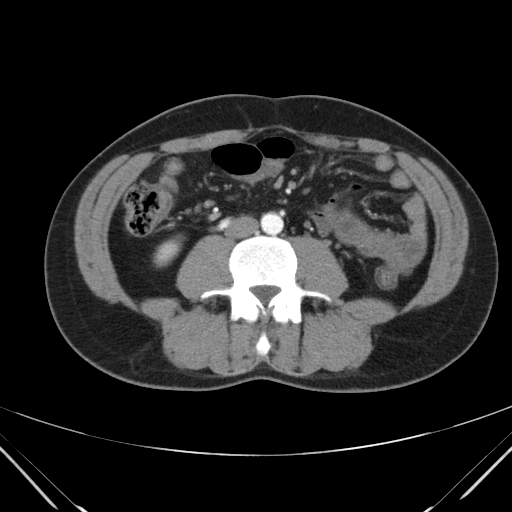
[im 60/97  soft-tissue]
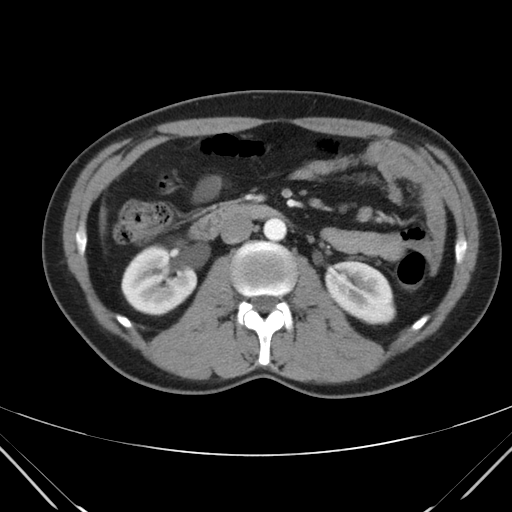
[im 74/97  soft-tissue]
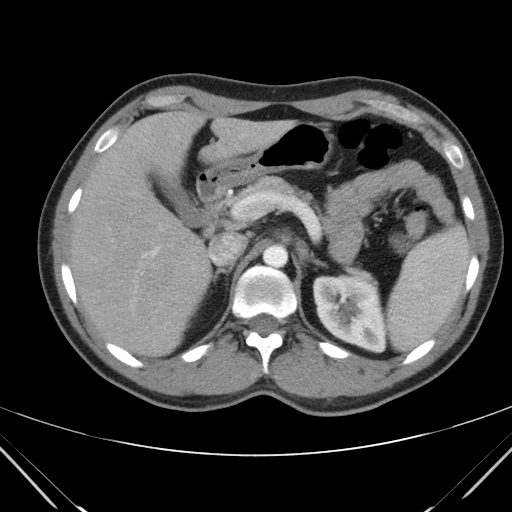
[im 87/97  soft-tissue]
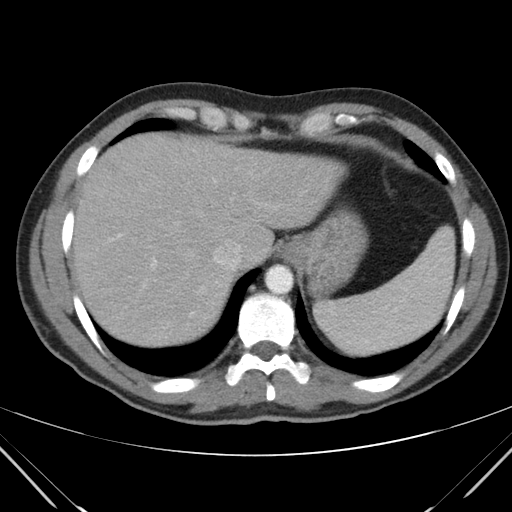

[Series 203: coronals, idose (2) · coronal · 0.45mm/px · 3 of 107 slices shown]
[im 36/107  soft-tissue]
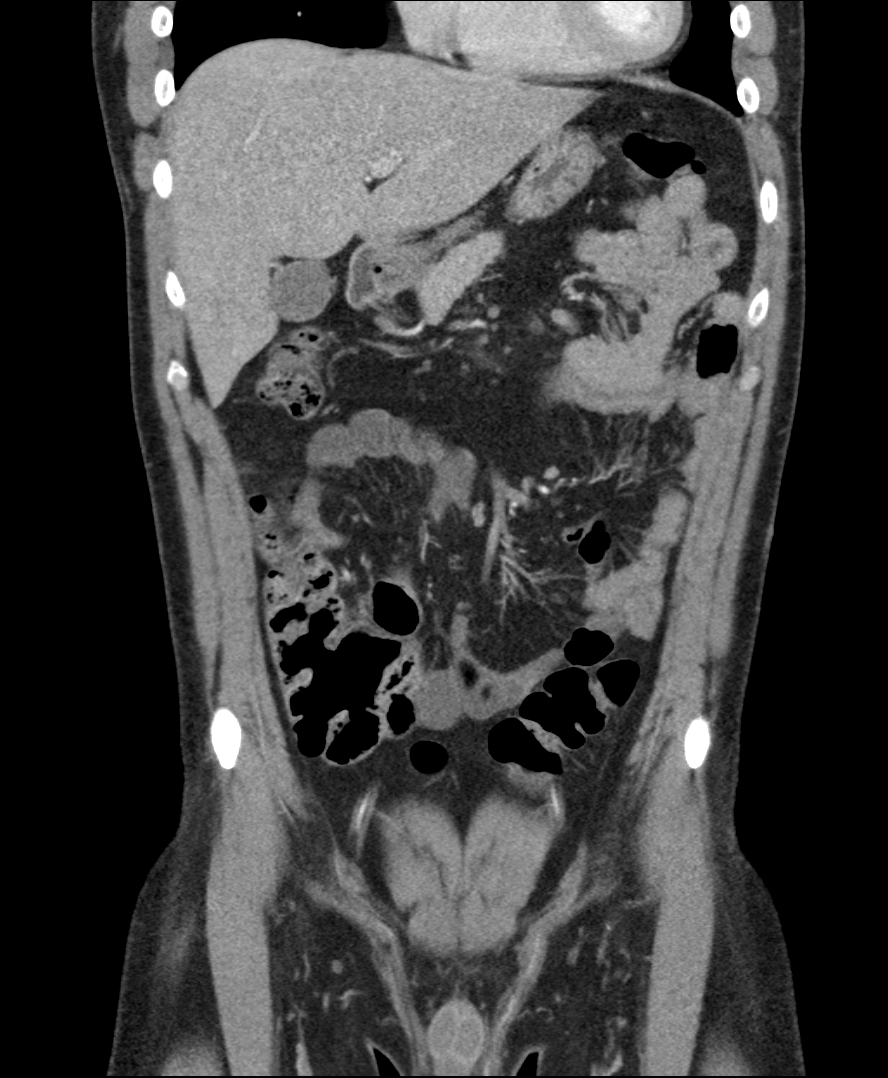
[im 48/107  soft-tissue]
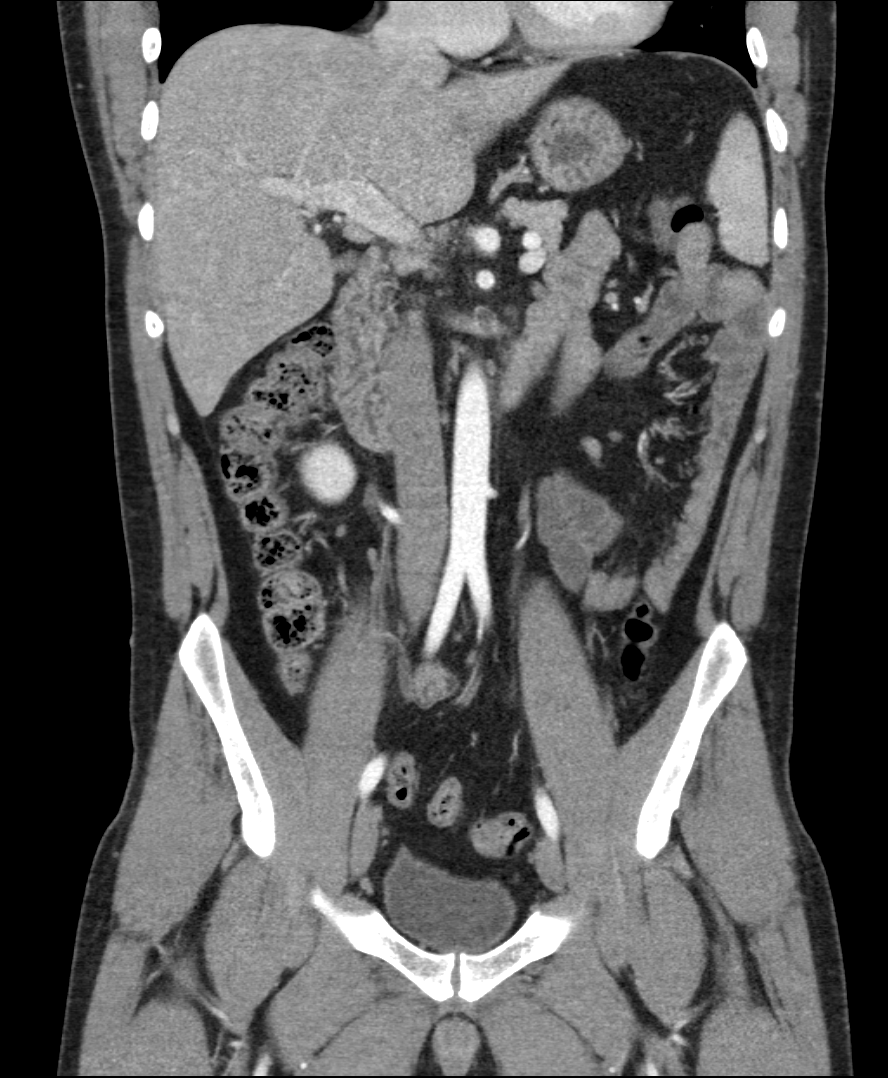
[im 59/107  soft-tissue]
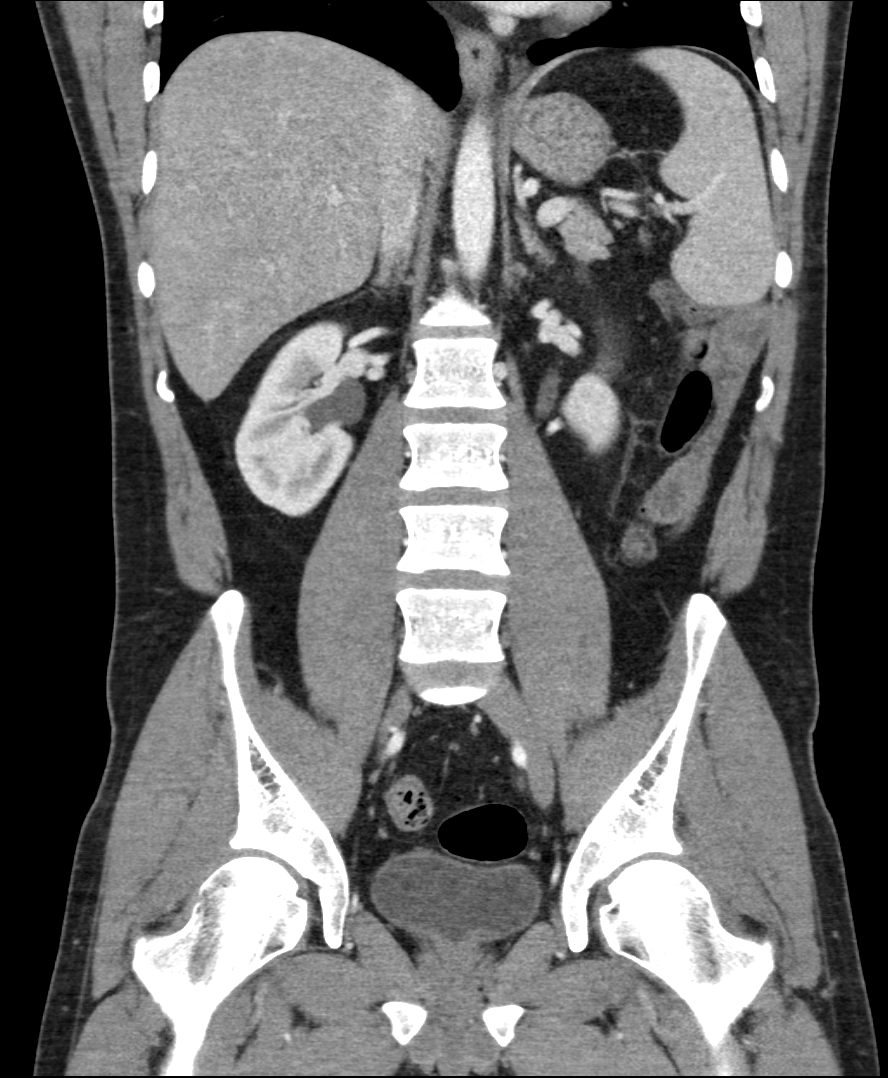

[10 of 46 positions shown; findings below may reference images not displayed]

FINDINGS: Lower chest:  Lung bases are unremarkable.

Hepatobiliary: Enhanced liver is unremarkable. No focal hepatic
mass. No intrahepatic biliary ductal dilatation. No calcified
gallstones are noted within gallbladder. No CBD dilatation.

Pancreas: No mass, inflammatory changes, or other significant
abnormality.

Spleen: Within normal limits in size and appearance.

Adrenals/Urinary Tract: No adrenal gland mass. Enhanced kidneys are
symmetrical in size. No hydronephrosis or hydroureter. The urinary
bladder is unremarkable.

Stomach/Bowel: No gastric outlet obstruction. No small bowel
obstruction. No thickened or dilated small bowel loops. Moderate
stool noted in right colon and cecum. No pericecal inflammation.
Normal appendix partially visualized in coronal image 46. The
terminal ileum is unremarkable. No evidence of colitis or
diverticulitis. No distal colonic obstruction.

Vascular/Lymphatic: No aortic aneurysm. No retroperitoneal or
mesenteric adenopathy.

Reproductive: Prostate gland and seminal vesicles are unremarkable.

Other: There is no ascites or free air.  No inguinal adenopathy.

Musculoskeletal: No destructive bony lesions are noted. Sagittal
images of the spine are unremarkable.
IMPRESSION: 1. There is no definite evidence of acute inflammatory process
within abdomen. No pericecal inflammation. Normal appendix. Moderate
stool noted in right colon and cecum.
2. No small bowel obstruction.  No colitis or diverticulitis.
3. No hydronephrosis or hydroureter.
4. No calcified gallstones are noted within gallbladder.

## 2016-10-26 DIAGNOSIS — M79674 Pain in right toe(s): Secondary | ICD-10-CM | POA: Diagnosis not present

## 2016-10-26 DIAGNOSIS — L03031 Cellulitis of right toe: Secondary | ICD-10-CM | POA: Diagnosis not present

## 2016-10-26 DIAGNOSIS — L02611 Cutaneous abscess of right foot: Secondary | ICD-10-CM | POA: Diagnosis not present

## 2016-11-09 DIAGNOSIS — L03031 Cellulitis of right toe: Secondary | ICD-10-CM | POA: Diagnosis not present

## 2017-03-15 DIAGNOSIS — G4712 Idiopathic hypersomnia without long sleep time: Secondary | ICD-10-CM | POA: Diagnosis not present

## 2017-06-06 DIAGNOSIS — H5213 Myopia, bilateral: Secondary | ICD-10-CM | POA: Diagnosis not present

## 2017-11-03 DIAGNOSIS — G4712 Idiopathic hypersomnia without long sleep time: Secondary | ICD-10-CM | POA: Diagnosis not present

## 2017-12-08 DIAGNOSIS — M79641 Pain in right hand: Secondary | ICD-10-CM | POA: Diagnosis not present

## 2018-02-16 DIAGNOSIS — Z0000 Encounter for general adult medical examination without abnormal findings: Secondary | ICD-10-CM | POA: Diagnosis not present

## 2018-02-16 DIAGNOSIS — Z862 Personal history of diseases of the blood and blood-forming organs and certain disorders involving the immune mechanism: Secondary | ICD-10-CM | POA: Diagnosis not present

## 2018-02-16 DIAGNOSIS — Z13220 Encounter for screening for lipoid disorders: Secondary | ICD-10-CM | POA: Diagnosis not present

## 2021-03-04 DIAGNOSIS — F419 Anxiety disorder, unspecified: Secondary | ICD-10-CM | POA: Diagnosis not present

## 2021-03-04 DIAGNOSIS — F321 Major depressive disorder, single episode, moderate: Secondary | ICD-10-CM | POA: Diagnosis not present

## 2021-03-04 DIAGNOSIS — R42 Dizziness and giddiness: Secondary | ICD-10-CM | POA: Diagnosis not present

## 2021-03-04 DIAGNOSIS — R001 Bradycardia, unspecified: Secondary | ICD-10-CM | POA: Diagnosis not present

## 2021-04-06 ENCOUNTER — Ambulatory Visit: Payer: BC Managed Care – PPO | Admitting: Cardiology
